# Patient Record
Sex: Female | Born: 1970 | Race: White | Hispanic: No | Marital: Married | Smoking: Never smoker
Health system: Southern US, Community
[De-identification: ages and names within clinical notes are randomized; demographics above are authoritative.]

## PROBLEM LIST (undated history)

## (undated) DIAGNOSIS — Z789 Other specified health status: Secondary | ICD-10-CM

## (undated) HISTORY — PX: BUNIONECTOMY: SHX129

---

## 2008-10-18 ENCOUNTER — Ambulatory Visit: Payer: Self-pay

## 2011-12-23 ENCOUNTER — Ambulatory Visit: Payer: Self-pay

## 2013-01-11 ENCOUNTER — Ambulatory Visit: Payer: Self-pay

## 2014-03-22 ENCOUNTER — Ambulatory Visit: Payer: Self-pay

## 2016-02-12 ENCOUNTER — Other Ambulatory Visit: Payer: Self-pay | Admitting: Physician Assistant

## 2016-02-12 DIAGNOSIS — Z1231 Encounter for screening mammogram for malignant neoplasm of breast: Secondary | ICD-10-CM

## 2016-02-29 ENCOUNTER — Encounter: Payer: Self-pay | Admitting: Radiology

## 2016-02-29 ENCOUNTER — Ambulatory Visit
Admission: RE | Admit: 2016-02-29 | Discharge: 2016-02-29 | Disposition: A | Discharge status: Home / Self Care | Payer: Managed Care, Other (non HMO) | Source: Ambulatory Visit | Attending: Physician Assistant | Admitting: Physician Assistant

## 2016-02-29 DIAGNOSIS — Z1231 Encounter for screening mammogram for malignant neoplasm of breast: Secondary | ICD-10-CM | POA: Diagnosis not present

## 2017-07-21 ENCOUNTER — Other Ambulatory Visit: Payer: Self-pay | Admitting: Obstetrics and Gynecology

## 2017-07-21 DIAGNOSIS — N63 Unspecified lump in unspecified breast: Secondary | ICD-10-CM

## 2017-07-27 ENCOUNTER — Ambulatory Visit
Admission: RE | Admit: 2017-07-27 | Discharge: 2017-07-27 | Disposition: A | Discharge status: Home / Self Care | Payer: 59 | Source: Ambulatory Visit | Attending: Obstetrics and Gynecology | Admitting: Obstetrics and Gynecology

## 2017-07-27 ENCOUNTER — Other Ambulatory Visit: Payer: Self-pay | Admitting: Obstetrics and Gynecology

## 2017-07-27 DIAGNOSIS — N63 Unspecified lump in unspecified breast: Secondary | ICD-10-CM

## 2017-07-27 DIAGNOSIS — N6320 Unspecified lump in the left breast, unspecified quadrant: Secondary | ICD-10-CM | POA: Insufficient documentation

## 2017-12-17 ENCOUNTER — Emergency Department
Admission: EM | Admit: 2017-12-17 | Discharge: 2017-12-17 | Disposition: A | Discharge status: Home / Self Care | Payer: 59 | Attending: Emergency Medicine | Admitting: Emergency Medicine

## 2017-12-17 ENCOUNTER — Emergency Department: Payer: 59

## 2017-12-17 ENCOUNTER — Encounter: Payer: Self-pay | Admitting: *Deleted

## 2017-12-17 ENCOUNTER — Other Ambulatory Visit: Payer: Self-pay

## 2017-12-17 DIAGNOSIS — S92352A Displaced fracture of fifth metatarsal bone, left foot, initial encounter for closed fracture: Secondary | ICD-10-CM | POA: Insufficient documentation

## 2017-12-17 DIAGNOSIS — Y9301 Activity, walking, marching and hiking: Secondary | ICD-10-CM | POA: Diagnosis not present

## 2017-12-17 DIAGNOSIS — S99922A Unspecified injury of left foot, initial encounter: Secondary | ICD-10-CM | POA: Diagnosis present

## 2017-12-17 DIAGNOSIS — Y999 Unspecified external cause status: Secondary | ICD-10-CM | POA: Insufficient documentation

## 2017-12-17 DIAGNOSIS — W109XXA Fall (on) (from) unspecified stairs and steps, initial encounter: Secondary | ICD-10-CM | POA: Insufficient documentation

## 2017-12-17 DIAGNOSIS — Y929 Unspecified place or not applicable: Secondary | ICD-10-CM | POA: Diagnosis not present

## 2017-12-17 DIAGNOSIS — S92252A Displaced fracture of navicular [scaphoid] of left foot, initial encounter for closed fracture: Secondary | ICD-10-CM | POA: Diagnosis not present

## 2017-12-17 MED ORDER — MELOXICAM 15 MG PO TABS: 15.0000 mg | ORAL_TABLET | Freq: Every day | ORAL | 0 refills | Status: AC

## 2017-12-17 MED ORDER — HYDROCODONE-ACETAMINOPHEN 5-325 MG PO TABS: 1.0000 | ORAL_TABLET | ORAL | 0 refills | Status: DC | PRN

## 2017-12-17 NOTE — ED Notes (Signed)
Patient reports she was going down stairs, missed the last two and fell. Patient reports popping in foot when she fell. Patient reports pain to left ankle and medial foot. Norco, 5-325 and 2 aleve approx 30 minutes ago.

## 2017-12-17 NOTE — ED Notes (Signed)
This RN reviewed discharge instructions, follow-up care, prescriptions, cryotherapy, and need for elevation with patient. Patient verbalized understanding of all reviewed information.  Patient stable, with no distress noted at this time. 

## 2017-12-17 NOTE — ED Provider Notes (Signed)
[facility] Emergency Department Provider Note  ____________________________________________  Time seen: Approximately 10:11 PM  I have reviewed the triage vital signs and the nursing notes.   HISTORY  Chief Complaint Ankle Pain    HPI Barbara Herring is a 47 y.o. female who presents the emergency department complaining of left ankle and foot pain.  Patient was rushing down a flight of steps, missed the last step landing awkwardly on her left ankle.  She reports that she inverted her ankle.  Patient reports pain, swelling immediately.  Patient's husband is an orthopedic physician assistant and splinted the foot, and gave her Norco and Aleve prior to arrival.  Patient is currently resting comfortably but states that she is unable to bear weight on the left ankle.  No other injury or complaint.  She did not hit her head or lose consciousness during the fall.    No past medical history on file.  There are no active problems to display for this patient.   No past surgical history on file.  Prior to Admission medications   Medication Sig Start Date End Date Taking? Authorizing Provider  HYDROcodone-acetaminophen (NORCO/VICODIN) 5-325 MG tablet Take 1 tablet by mouth every 4 (four) hours as needed for moderate pain. 12/17/17   Riata Ikeda, Delorise Royals, PA-C  meloxicam (MOBIC) 15 MG tablet Take 1 tablet (15 mg total) by mouth daily. 12/17/17   Reinhold Rickey, Delorise Royals, PA-C    Allergies Patient has no known allergies.  Family History  Problem Relation Age of Onset  . Breast cancer Neg Hx     Social History Social History   Tobacco Use  . Smoking status: Never Smoker  . Smokeless tobacco: Never Used  Substance Use Topics  . Alcohol use: Never    Frequency: Never  . Drug use: Never     Review of Systems  Constitutional: No fever/chills Eyes: No visual changes.  Cardiovascular: no chest pain. Respiratory: no cough. No SOB. Gastrointestinal: No  abdominal pain.  No nausea, no vomiting.  Musculoskeletal: Positive for left ankle pain and injury. Skin: Negative for rash, abrasions, lacerations, ecchymosis. Neurological: Negative for headaches, focal weakness or numbness. 10-point ROS otherwise negative.  ____________________________________________   PHYSICAL EXAM:  VITAL SIGNS: ED Triage Vitals  Enc Vitals Group     BP 12/17/17 2130 (!) 150/75     Pulse Rate 12/17/17 2130 83     Resp 12/17/17 2130 18     Temp 12/17/17 2130 98.6 F (37 C)     Temp Source 12/17/17 2130 Oral     SpO2 12/17/17 2130 99 %     Weight 12/17/17 2131 220 lb (99.8 kg)     Height 12/17/17 2131 5\' 3"  (1.6 m)     Head Circumference --      Peak Flow --      Pain Score 12/17/17 2131 8     Pain Loc --      Pain Edu? --      Excl. in GC? --      Constitutional: Alert and oriented. Well appearing and in no acute distress. Eyes: Conjunctivae are normal. PERRL. EOMI. Head: Atraumatic. Neck: No stridor.    Cardiovascular: Normal rate, regular rhythm. Normal S1 and S2.  Good peripheral circulation. Respiratory: Normal respiratory effort without tachypnea or retractions. Lungs CTAB. Good air entry to the bases with no decreased or absent breath sounds. Musculoskeletal: Full range of motion to all extremities. No gross deformities appreciated.  Visualization of the left  ankle reveals edema without significant ecchymosis.  No abrasions or lacerations noted.  At this time, no range of motion testing is performed.  Patient is minimally tender to palpation over bilateral malleolus is, she is most tender to palpation along the talonavicular joint line.  No palpable abnormality or deficit.  Patient also has some tenderness to palpation over the proximal fourth and fifth metatarsal region.  No palpable abnormality to this region.  Dorsalis pedis pulse intact.  Sensation intact all 5 digits.  Capillary refill less than 2 seconds all digits. Neurologic:  Normal speech  and language. No gross focal neurologic deficits are appreciated.  Skin:  Skin is warm, dry and intact. No rash noted. Psychiatric: Mood and affect are normal. Speech and behavior are normal. Patient exhibits appropriate insight and judgement.   ____________________________________________   LABS (all labs ordered are listed, but only abnormal results are displayed)  Labs Reviewed - No data to display ____________________________________________  EKG   ____________________________________________  RADIOLOGY I personally viewed and evaluated these images as part of my medical decision making, as well as reviewing the written report by the radiologist.  I concur with radiologist finding of navicular fracture, fracture the fifth metatarsal.  Dg Ankle Complete Left  Result Date: 12/17/2017 CLINICAL DATA:  Status post fall with left foot and ankle pain. EXAM: LEFT ANKLE COMPLETE - 3+ VIEW COMPARISON:  None. FINDINGS: There is displaced fracture of the navicular bone of the midfoot. The visualized tibia and fibula demonstrate no acute fracture or dislocation. IMPRESSION: Displaced fracture of the navicular bone of the midfoot. Electronically Signed   By: Sherian Rein M.D.   On: 12/17/2017 21:59   Dg Foot Complete Left  Result Date: 12/17/2017 CLINICAL DATA:  Status post fall with left foot and ankle pain. EXAM: LEFT FOOT - COMPLETE 3+ VIEW COMPARISON:  None. FINDINGS: There is displaced fracture of the fifth metatarsal. There is fracture of the navicular bone. There is no dislocation. IMPRESSION: Fracture of the fifth metatarsal.  Fracture of navicular bone. Electronically Signed   By: Sherian Rein M.D.   On: 12/17/2017 22:00    ____________________________________________    PROCEDURES  Procedure(s) performed:    Procedures    Medications - No data to display   ____________________________________________   INITIAL IMPRESSION / ASSESSMENT AND PLAN / ED  COURSE  Pertinent labs & imaging results that were available during my care of the patient were reviewed by me and considered in my medical decision making (see chart for details).  Review of the [place] CSRS was performed in accordance of the [facility] prior to dispensing any controlled drugs.      Patient's diagnosis is consistent with navicular fracture and fracture of the fifth metatarsal.  Patient presented to the emergency department complaining of left ankle and foot pain after missing a step on the stairway.  Patient was unable to bear weight, with edema to the area.  Patient arrived with ankle splinted as her husband is a Database administrator.  Patient was given meds for pain and was relatively pain-free at time of evaluation.  X-rays reveal navicular fracture, mildly displaced as well as a fracture of the fifth metatarsal, also displaced.  No indication of acute ligamentous rupture.  No indication of Lisfranc injury.  No indication for further imaging.  Patient's foot is resplinted in the emergency department, she will be prescribed meloxicam and Vicodin for symptom relief.  Patient is to follow-up with podiatry for further management..  Patient is given  ED precautions to return to the ED for any worsening or new symptoms.     ____________________________________________  FINAL CLINICAL IMPRESSION(S) / ED DIAGNOSES  Final diagnoses:  Closed displaced fracture of navicular bone of left foot, initial encounter  Closed displaced fracture of fifth metatarsal bone of left foot, initial encounter      NEW MEDICATIONS STARTED DURING THIS VISIT:  ED Discharge Orders        Ordered    HYDROcodone-acetaminophen (NORCO/VICODIN) 5-325 MG tablet  Every 4 hours PRN     12/17/17 2229    meloxicam (MOBIC) 15 MG tablet  Daily     12/17/17 2229          This chart was dictated using voice recognition software/Dragon. Despite best efforts to proofread, errors can occur which can change  the meaning. Any change was purely unintentional.    Racheal PatchesCuthriell, Latrise Bowland D, PA-C 12/17/17 2231    Pershing ProudSchaevitz, Myra Rudeavid Matthew, MD 12/17/17 [phone]

## 2017-12-17 NOTE — ED Notes (Signed)
X-ray at bedside

## 2017-12-17 NOTE — ED Notes (Signed)
Splint in place - patient's spouse a PA; approved by ED physician. Patient has crutches, and verbalized understanding of how to use them.

## 2017-12-17 NOTE — ED Triage Notes (Signed)
Pt states she was rushing inside the house and fell down two steps.  Pt has left ankle pain.  Ace wrap in place.

## 2017-12-18 ENCOUNTER — Other Ambulatory Visit: Payer: Self-pay | Admitting: Podiatry

## 2017-12-21 ENCOUNTER — Encounter: Payer: Self-pay | Admitting: *Deleted

## 2017-12-21 ENCOUNTER — Other Ambulatory Visit: Payer: Self-pay

## 2017-12-22 NOTE — Discharge Instructions (Signed)
[facility] °[facility] ° °POST OPERATIVE INSTRUCTIONS FOR DR. TROXLER AND DR. FOWLER °[facility] ° ° °1. Take your medication as prescribed.  Pain medication should be taken only as needed. ° °2. Keep the dressing clean, dry and intact. ° °3. Keep your foot elevated above the heart level for the first 48 hours. ° °4. Walking to the bathroom and brief periods of walking are acceptable, unless we have instructed you to be non-weight bearing. ° °5. Always wear your post-op shoe when walking.  Always use your crutches if you are to be non-weight bearing. ° °6. Do not take a shower. Baths are permissible as long as the foot is kept out of the water.  ° °7. Every hour you are awake:  °- Bend your knee 15 times. °- Flex foot 15 times °- Massage calf 15 times ° °8. Call [facility] ([phone]) if any of the following problems occur: °- You develop a temperature or fever. °- The bandage becomes saturated with blood. °- Medication does not stop your pain. °- Injury of the foot occurs. °- Any symptoms of infection including redness, odor, or red streaks running from wound. ° ° °General Anesthesia, Adult, Care After °These instructions provide you with information about caring for yourself after your procedure. Your health care provider may also give you more specific instructions. Your treatment has been planned according to current medical practices, but problems sometimes occur. Call your health care provider if you have any problems or questions after your procedure. °What can I expect after the procedure? °After the procedure, it is common to have: °· Vomiting. °· A sore throat. °· Mental slowness. ° °It is common to feel: °· Nauseous. °· Cold or shivery. °· Sleepy. °· Tired. °· Sore or achy, even in parts of your body where you did not have surgery. ° °Follow these instructions at home: °For at least 24 hours after the procedure: °· Do not: °? Participate in  activities where you could fall or become injured. °? Drive. °? Use heavy machinery. °? Drink alcohol. °? Take sleeping pills or medicines that cause drowsiness. °? Make important decisions or sign legal documents. °? Take care of children on your own. °· Rest. °Eating and drinking °· If you vomit, drink water, juice, or soup when you can drink without vomiting. °· Drink enough fluid to keep your urine clear or pale yellow. °· Make sure you have little or no nausea before eating solid foods. °· Follow the diet recommended by your health care provider. °General instructions °· Have a responsible adult stay with you until you are awake and alert. °· Return to your normal activities as told by your health care provider. Ask your health care provider what activities are safe for you. °· Take over-the-counter and prescription medicines only as told by your health care provider. °· If you smoke, do not smoke without supervision. °· Keep all follow-up visits as told by your health care provider. This is important. °Contact a health care provider if: °· You continue to have nausea or vomiting at home, and medicines are not helpful. °· You cannot drink fluids or start eating again. °· You cannot urinate after 8-12 hours. °· You develop a skin rash. °· You have fever. °· You have increasing redness at the site of your procedure. °Get help right away if: °· You have difficulty breathing. °· You have chest pain. °· You have unexpected bleeding. °· You feel that you   are having a life-threatening or urgent problem. °This information is not intended to replace advice given to you by your health care provider. Make sure you discuss any questions you have with your health care provider. °Document Released: 08/25/2000 Document Revised: 10/22/2015 Document Reviewed: 05/03/2015 °Elsevier Interactive Patient Education © 2018 [employer] ° °

## 2017-12-23 ENCOUNTER — Encounter
Admission: RE | Disposition: A | Discharge status: Home / Self Care | Payer: Self-pay | Source: Ambulatory Visit | Attending: Podiatry

## 2017-12-23 ENCOUNTER — Ambulatory Visit: Payer: 59 | Admitting: Anesthesiology

## 2017-12-23 ENCOUNTER — Ambulatory Visit
Admission: RE | Admit: 2017-12-23 | Discharge: 2017-12-23 | Disposition: A | Discharge status: Home / Self Care | Payer: 59 | Source: Ambulatory Visit | Attending: Podiatry | Admitting: Podiatry

## 2017-12-23 DIAGNOSIS — S92255A Nondisplaced fracture of navicular [scaphoid] of left foot, initial encounter for closed fracture: Secondary | ICD-10-CM | POA: Insufficient documentation

## 2017-12-23 DIAGNOSIS — X501XXA Overexertion from prolonged static or awkward postures, initial encounter: Secondary | ICD-10-CM | POA: Diagnosis not present

## 2017-12-23 DIAGNOSIS — W19XXXA Unspecified fall, initial encounter: Secondary | ICD-10-CM | POA: Diagnosis not present

## 2017-12-23 DIAGNOSIS — Z6838 Body mass index (BMI) 38.0-38.9, adult: Secondary | ICD-10-CM | POA: Insufficient documentation

## 2017-12-23 DIAGNOSIS — S92352A Displaced fracture of fifth metatarsal bone, left foot, initial encounter for closed fracture: Secondary | ICD-10-CM | POA: Insufficient documentation

## 2017-12-23 HISTORY — DX: Other specified health status: Z78.9

## 2017-12-23 HISTORY — PX: OPEN REDUCTION INTERNAL FIXATION (ORIF) FOOT LISFRANC FRACTURE: SHX5990

## 2017-12-23 SURGERY — OPEN REDUCTION INTERNAL FIXATION (ORIF) FOOT LISFRANC FRACTURE
Anesthesia: General | Site: Foot | Laterality: Left | Wound class: Clean

## 2017-12-23 MED ORDER — POVIDONE-IODINE 7.5 % EX SOLN
Freq: Once | CUTANEOUS | Status: AC
  Administered 2017-12-23: 14:00:00 via TOPICAL

## 2017-12-23 MED ORDER — LIDOCAINE HCL (CARDIAC) PF 100 MG/5ML IV SOSY
PREFILLED_SYRINGE | INTRAVENOUS | Status: DC | PRN
  Administered 2017-12-23: 50 mg via INTRATRACHEAL

## 2017-12-23 MED ORDER — FENTANYL CITRATE (PF) 100 MCG/2ML IJ SOLN
INTRAMUSCULAR | Status: DC | PRN
  Administered 2017-12-23 (×3): 12.5 ug via INTRAVENOUS
  Administered 2017-12-23: 50 ug via INTRAVENOUS

## 2017-12-23 MED ORDER — LACTATED RINGERS IV SOLN
INTRAVENOUS | Status: DC
  Administered 2017-12-23: 14:00:00 via INTRAVENOUS

## 2017-12-23 MED ORDER — DEXAMETHASONE SODIUM PHOSPHATE 4 MG/ML IJ SOLN
INTRAMUSCULAR | Status: DC | PRN
  Administered 2017-12-23: 4 mg via INTRAVENOUS

## 2017-12-23 MED ORDER — PROPOFOL 10 MG/ML IV BOLUS
INTRAVENOUS | Status: DC | PRN
  Administered 2017-12-23: 130 mg via INTRAVENOUS
  Administered 2017-12-23: 20 mg via INTRAVENOUS

## 2017-12-23 MED ORDER — GLYCOPYRROLATE 0.2 MG/ML IJ SOLN
INTRAMUSCULAR | Status: DC | PRN
  Administered 2017-12-23: 0.1 mg via INTRAVENOUS

## 2017-12-23 MED ORDER — HYDROCODONE-ACETAMINOPHEN 5-325 MG PO TABS: 1.0000 | ORAL_TABLET | Freq: Four times a day (QID) | ORAL | 0 refills | Status: DC | PRN

## 2017-12-23 MED ORDER — BUPIVACAINE-EPINEPHRINE 0.25% -1:200000 IJ SOLN
INTRAMUSCULAR | Status: DC | PRN
  Administered 2017-12-23: 10 mL

## 2017-12-23 MED ORDER — OXYCODONE HCL 5 MG/5ML PO SOLN: 5.0000 mg | Freq: Once | ORAL | Status: DC | PRN

## 2017-12-23 MED ORDER — CEFAZOLIN SODIUM-DEXTROSE 2-4 GM/100ML-% IV SOLN
2.0000 g | INTRAVENOUS | Status: AC
  Administered 2017-12-23: 2 g via INTRAVENOUS

## 2017-12-23 MED ORDER — OXYCODONE HCL 5 MG PO TABS: 5.0000 mg | ORAL_TABLET | Freq: Once | ORAL | Status: DC | PRN

## 2017-12-23 MED ORDER — ONDANSETRON HCL 4 MG/2ML IJ SOLN
INTRAMUSCULAR | Status: DC | PRN
  Administered 2017-12-23: 4 mg via INTRAVENOUS

## 2017-12-23 MED ORDER — MIDAZOLAM HCL 5 MG/5ML IJ SOLN
INTRAMUSCULAR | Status: DC | PRN
  Administered 2017-12-23: 2 mg via INTRAVENOUS

## 2017-12-23 MED ORDER — BUPIVACAINE LIPOSOME 1.3 % IJ SUSP
INTRAMUSCULAR | Status: DC | PRN
  Administered 2017-12-23: 10 mL

## 2017-12-23 MED ORDER — FENTANYL CITRATE (PF) 100 MCG/2ML IJ SOLN: 25.0000 ug | INTRAMUSCULAR | Status: DC | PRN

## 2017-12-23 MED ORDER — BUPIVACAINE HCL (PF) 0.25 % IJ SOLN
INTRAMUSCULAR | Status: DC | PRN
  Administered 2017-12-23: 10 mL

## 2017-12-23 SURGICAL SUPPLY — 48 items
2.0 MM DRILL BIT ×2 IMPLANT
BANDAGE ELASTIC 4 LF NS (GAUZE/BANDAGES/DRESSINGS) ×2 IMPLANT
BLADE SURG MINI STRL (BLADE) IMPLANT
BNDG COHESIVE 4X5 TAN STRL (GAUZE/BANDAGES/DRESSINGS) ×2 IMPLANT
BNDG ESMARK 4X12 TAN STRL LF (GAUZE/BANDAGES/DRESSINGS) ×2 IMPLANT
BNDG GAUZE 4.5X4.1 6PLY STRL (MISCELLANEOUS) ×2 IMPLANT
BNDG STRETCH 4X75 STRL LF (GAUZE/BANDAGES/DRESSINGS) ×2 IMPLANT
CANISTER SUCT 1200ML W/VALVE (MISCELLANEOUS) ×2 IMPLANT
COVER PIN YLW 0.028-062 (MISCELLANEOUS) IMPLANT
DRAPE FLUOR MINI C-ARM 54X84 (DRAPES) ×2 IMPLANT
DURAPREP 26ML APPLICATOR (WOUND CARE) ×2 IMPLANT
ELECT REM PT RETURN 9FT ADLT (ELECTROSURGICAL) ×2
ELECTRODE REM PT RTRN 9FT ADLT (ELECTROSURGICAL) ×1 IMPLANT
GAUZE PETRO XEROFOAM 1X8 (MISCELLANEOUS) ×2 IMPLANT
GAUZE SPONGE 4X4 12PLY STRL (GAUZE/BANDAGES/DRESSINGS) ×2 IMPLANT
GLOVE BIO SURGEON STRL SZ7.5 (GLOVE) ×4 IMPLANT
GLOVE INDICATOR 8.0 STRL GRN (GLOVE) ×4 IMPLANT
GOWN STRL REUS W/ TWL LRG LVL3 (GOWN DISPOSABLE) ×2 IMPLANT
GOWN STRL REUS W/TWL LRG LVL3 (GOWN DISPOSABLE) ×2
K-WIRE ACE 1.6X6 (WIRE) ×2
K-WIRE DBL END TROCAR 6X.045 (WIRE)
K-WIRE DBL END TROCAR 6X.062 (WIRE)
KIT TURNOVER KIT A (KITS) ×2 IMPLANT
KWIRE ACE 1.6X6 (WIRE) ×1 IMPLANT
KWIRE DBL END TROCAR 6X.045 (WIRE) IMPLANT
KWIRE DBL END TROCAR 6X.062 (WIRE) IMPLANT
NEEDLE FILTER BLUNT 18X 1/2SAF (NEEDLE)
NEEDLE FILTER BLUNT 18X1 1/2 (NEEDLE) IMPLANT
NEEDLE HYPO 27GX1-1/4 (NEEDLE) ×2 IMPLANT
NS IRRIG 500ML POUR BTL (IV SOLUTION) ×2 IMPLANT
PACK EXTREMITY ARMC (MISCELLANEOUS) ×2 IMPLANT
PENCIL SMOKE EVACUATOR (MISCELLANEOUS) ×2 IMPLANT
PLATE F3 FRAG HI FLEX RT (Plate) ×2 IMPLANT
SCREW PEG 2.5X14 NONLOCK (Screw) ×2 IMPLANT
SCREW PEG LOCK 2.5X10 (Peg) ×2 IMPLANT
SCREW PEG LOCK 2.5X12 (Screw) ×4 IMPLANT
SCREW PEG LOCK 2.5X14 (Peg) ×2 IMPLANT
STOCKINETTE IMPERVIOUS LG (DRAPES) ×2 IMPLANT
STRAP BODY AND KNEE 60X3 (MISCELLANEOUS) ×2 IMPLANT
STRIP CLOSURE SKIN 1/4X4 (GAUZE/BANDAGES/DRESSINGS) ×2 IMPLANT
SUT ETHILON 4-0 (SUTURE)
SUT ETHILON 4-0 FS2 18XMFL BLK (SUTURE)
SUT ETHILON 5-0 FS-2 18 BLK (SUTURE) IMPLANT
SUT MNCRL+ 5-0 UNDYED PC-3 (SUTURE) ×1 IMPLANT
SUT MONOCRYL 5-0 (SUTURE) ×1
SUT VIC AB 4-0 FS2 27 (SUTURE) ×2 IMPLANT
SUTURE ETHLN 4-0 FS2 18XMF BLK (SUTURE) IMPLANT
SYR 10ML LL (SYRINGE) ×2 IMPLANT

## 2017-12-23 NOTE — Op Note (Signed)
Operative note   Surgeon:Baraa Tubbs Armed forces logistics/support/administrative officerowler    Assistant: None    Preop diagnosis: Left fifth metatarsal fracture    Postop diagnosis: Same    Procedure: ORIF left fifth metatarsal fracture    EBL: Minimal    Anesthesia:local and general.  Local consisted of 0.25% bupivacaine with epinephrine preoperatively.  A total of 10 cc was used.  At the end of the procedure a one-to-one mixture of 0.25% bupivacaine plain and 10 cc of long-acting Exparel was infiltrated on the surgical site.  A total of 16 cc was used.    Hemostasis: Ankle tourniquet inflated to 200 mmHg for 50 minutes    Specimen: None    Complications: None    Operative indications:Barbara Herring CzarLeigh Lenard ForthMundy is an 47 y.o. that presents today for surgical intervention.  The risks/benefits/alternatives/complications have been discussed and consent has been given.    Procedure:  Patient was brought into the OR and placed on the operating table in thesupine position. After anesthesia was obtained theleft lower extremity was prepped and draped in usual sterile fashion.  Attention was directed to the dorsal lateral left fifth MTPJ where longitudinal incision was performed.  Sharp and blunt dissection carried down to the periosteum.  Subperiosteal dissection was then undertaken.  There was noted to be a laterally and dorsally displaced fifth metatarsal fracture with a butterfly fragment.  This was then reduced with bone reduction clamp to an anatomically aligned position.  Next a L plate from the F3 fragment screw set by [employer] was placed.  2 locking screws were placed distal to the fracture site.  A nonlocking screw was placed against the lateral wall of the fifth metatarsal to compress the plate against the bone.  2 other screw holes were filled with locking screws.  Anatomic alignment was noted with good reduction and stability.  The wound was flushed with copious amounts of irrigation.  Layered closure was performed with 4-0 Vicryl the subtenons  tissue and periosteum.  5-0 Monocryl undyed for the skin was used.  At this time attention was directed to the dorsal midfoot where a known navicular fracture was seen preoperatively.  I put the foot through midfoot stress to see if there was any motion to the fracture site or instability to this.  The small chip fracture on the dorsal navicular was noted but did not displace at all.  It remained anatomic throughout stress.  This time I elected not to undergo surgical reduction of this fracture site.  Patient was placed in a well compressive sterile dressing.  She was then placed in a equalizer walker boot with foot at 90 degrees.    Patient tolerated the procedure and anesthesia well.  Was transported from the OR to the PACU with all vital signs stable and vascular status intact. To be discharged per routine protocol.  Will follow up in approximately 1 week in the outpatient clinic.

## 2017-12-23 NOTE — Anesthesia Procedure Notes (Signed)
Procedure Name: LMA Insertion Date/Time: 12/23/2017 4:00 PM Performed by: Jimmy PicketAmyot, Demi Trieu, CRNA Pre-anesthesia Checklist: Patient identified, Emergency Drugs available, Suction available, Timeout performed and Patient being monitored Patient Re-evaluated:Patient Re-evaluated prior to induction Oxygen Delivery Method: Circle system utilized Preoxygenation: Pre-oxygenation with 100% oxygen Induction Type: IV induction LMA: LMA inserted LMA Size: 4.0 Number of attempts: 1 Placement Confirmation: positive ETCO2 and breath sounds checked- equal and bilateral Tube secured with: Tape

## 2017-12-23 NOTE — Transfer of Care (Addendum)
Immediate Anesthesia Transfer of Care Note  Patient: Barbara Herring  Procedure(s) Performed: OPEN REDUCTION INTERNAL FIXATION (ORIF) FOOT LISFRANC FRACTURE/ 5TH METATARSAL LEFT/ NAVICULAR [id number], [phone] (Left Foot)  Patient Location: PACU  Anesthesia Type: General LMA  Level of Consciousness: awake, alert  and patient cooperative  Airway and Oxygen Therapy: Patient Spontanous Breathing and Patient connected to supplemental oxygen  Post-op Assessment: Post-op Vital signs reviewed, Patient's Cardiovascular Status Stable, Respiratory Function Stable, Patent Airway and No signs of Nausea or vomiting  Post-op Vital Signs: Reviewed and stable  Complications: No apparent anesthesia complications

## 2017-12-23 NOTE — Anesthesia Postprocedure Evaluation (Signed)
Anesthesia Post Note  Patient: Barbara MalmLaurie Leigh Folkes  Procedure(s) Performed: OPEN REDUCTION INTERNAL FIXATION (ORIF) FOOT LISFRANC FRACTURE/ 5TH METATARSAL LEFT/ NAVICULAR [id number], [phone] (Left Foot)  Patient location during evaluation: PACU Anesthesia Type: General Level of consciousness: awake and alert Pain management: pain level controlled Vital Signs Assessment: post-procedure vital signs reviewed and stable Respiratory status: spontaneous breathing Cardiovascular status: blood pressure returned to baseline Postop Assessment: no headache Anesthetic complications: no    Verner Cholunkle, III,  Reshaun Briseno D

## 2017-12-23 NOTE — H&P (Signed)
HISTORY AND PHYSICAL INTERVAL NOTE:  12/23/2017  3:39 PM  Barbara Herring  has presented today for surgery, with the diagnosis of S92.352A,  S92.255A.  The various methods of treatment have been discussed with the patient.  No guarantees were given.  After consideration of risks, benefits and other options for treatment, the patient has consented to surgery.  I have reviewed the patients' chart and labs.    Patient Vitals for the past 24 hrs:  BP Temp Temp src Pulse Resp SpO2 Height Weight  12/23/17 1358 125/66 98.1 F (36.7 C) Temporal 88 16 99 % 5\' 3"  (1.6 m) 99.8 kg (220 lb)    Herring history and physical examination was performed in my office.  The patient was reexamined.  There have been no changes to this history and physical examination.  Gwyneth RevelsFowler, Barbara Herring

## 2017-12-23 NOTE — Anesthesia Preprocedure Evaluation (Signed)
Anesthesia Evaluation  Patient identified by MRN, date of birth, ID band Patient awake    Reviewed: Allergy & Precautions, H&P , NPO status , Patient's Chart, lab work & pertinent test results  Airway Mallampati: I  TM Distance: >3 FB Neck ROM: full    Dental no notable dental hx.    Pulmonary neg pulmonary ROS,    Pulmonary exam normal breath sounds clear to auscultation       Cardiovascular negative cardio ROS Normal cardiovascular exam Rhythm:regular Rate:Normal     Neuro/Psych    GI/Hepatic negative GI ROS, Neg liver ROS,   Endo/Other  negative endocrine ROS  Renal/GU negative Renal ROS     Musculoskeletal   Abdominal   Peds  Hematology negative hematology ROS (+)   Anesthesia Other Findings   Reproductive/Obstetrics negative OB ROS                             Anesthesia Physical Anesthesia Plan  ASA: I  Anesthesia Plan: General LMA   Post-op Pain Management:    Induction:   PONV Risk Score and Plan:   Airway Management Planned:   Additional Equipment:   Intra-op Plan:   Post-operative Plan:   Informed Consent: I have reviewed the patients History and Physical, chart, labs and discussed the procedure including the risks, benefits and alternatives for the proposed anesthesia with the patient or authorized representative who has indicated his/her understanding and acceptance.     Plan Discussed with:   Anesthesia Plan Comments:         Anesthesia Quick Evaluation

## 2017-12-24 ENCOUNTER — Encounter: Payer: Self-pay | Admitting: Podiatry

## 2019-08-28 ENCOUNTER — Ambulatory Visit: Payer: Self-pay | Attending: Internal Medicine

## 2019-08-28 DIAGNOSIS — Z23 Encounter for immunization: Secondary | ICD-10-CM

## 2019-08-28 NOTE — Progress Notes (Signed)
   Covid-19 Vaccination Clinic  Name:  Barbara Herring    MRN: [id number] DOB: 26-Feb-1971  08/28/2019  [name] was observed post Covid-19 immunization for 15 minutes without incident. She was provided with Vaccine Information Sheet and instruction to access the V-Safe system.   [name] was instructed to call 911 with any severe reactions post vaccine: Marland Kitchen Difficulty breathing  . Swelling of face and throat  . A fast heartbeat  . A bad rash all over body  . Dizziness and weakness   Immunizations Administered    Name Date Dose VIS Date Route   Pfizer COVID-19 Vaccine 08/28/2019 11:28 AM 0.3 mL 05/13/2019 Intramuscular   Manufacturer: [employer], [employer]   Lot: [id number]   NDC: [address]-8

## 2019-09-20 ENCOUNTER — Ambulatory Visit: Payer: Self-pay | Attending: Internal Medicine

## 2019-09-20 DIAGNOSIS — Z23 Encounter for immunization: Secondary | ICD-10-CM

## 2019-09-20 NOTE — Progress Notes (Signed)
   Covid-19 Vaccination Clinic  Name:  Barbara Herring    MRN: [id number] DOB: [birth year]  09/20/2019  Barbara Herring was observed post Covid-19 immunization for 15 minutes without incident. She was provided with Vaccine Information Sheet and instruction to access the V-Safe system.   Barbara Herring was instructed to call 911 with any severe reactions post vaccine: Marland Kitchen Difficulty breathing  . Swelling of face and throat  . A fast heartbeat  . A bad rash all over body  . Dizziness and weakness   Immunizations Administered    Name Date Dose VIS Date Route   Pfizer COVID-19 Vaccine 09/20/2019  1:24 PM 0.3 mL 07/27/2018 Intramuscular   Manufacturer: [employer], [employer]   Lot: [id number]   NDC: [address]-9

## 2021-08-14 ENCOUNTER — Other Ambulatory Visit: Payer: Self-pay | Admitting: Physician Assistant

## 2021-08-14 DIAGNOSIS — Z1231 Encounter for screening mammogram for malignant neoplasm of breast: Secondary | ICD-10-CM

## 2021-09-24 ENCOUNTER — Ambulatory Visit
Admission: RE | Admit: 2021-09-24 | Discharge: 2021-09-24 | Disposition: A | Discharge status: Home / Self Care | Payer: Managed Care, Other (non HMO) | Source: Ambulatory Visit | Attending: Physician Assistant | Admitting: Physician Assistant

## 2021-09-24 DIAGNOSIS — Z1231 Encounter for screening mammogram for malignant neoplasm of breast: Secondary | ICD-10-CM | POA: Insufficient documentation

## 2022-03-17 DIAGNOSIS — S2239XA Fracture of one rib, unspecified side, initial encounter for closed fracture: Secondary | ICD-10-CM

## 2022-03-17 HISTORY — DX: Fracture of one rib, unspecified side, initial encounter for closed fracture: S22.39XA

## 2022-04-03 ENCOUNTER — Other Ambulatory Visit: Payer: Self-pay | Admitting: Orthopedic Surgery

## 2022-04-03 DIAGNOSIS — M25562 Pain in left knee: Secondary | ICD-10-CM

## 2022-04-04 ENCOUNTER — Ambulatory Visit
Admission: RE | Admit: 2022-04-04 | Discharge: 2022-04-04 | Disposition: A | Discharge status: Home / Self Care | Payer: Managed Care, Other (non HMO) | Source: Ambulatory Visit | Attending: Orthopedic Surgery | Admitting: Orthopedic Surgery

## 2022-04-04 DIAGNOSIS — M25562 Pain in left knee: Secondary | ICD-10-CM

## 2022-04-09 ENCOUNTER — Other Ambulatory Visit: Payer: Self-pay | Admitting: Orthopedic Surgery

## 2022-04-16 ENCOUNTER — Encounter: Payer: Self-pay | Admitting: Orthopedic Surgery

## 2022-04-21 ENCOUNTER — Ambulatory Visit: Payer: Managed Care, Other (non HMO) | Admitting: Anesthesiology

## 2022-04-21 ENCOUNTER — Other Ambulatory Visit: Payer: Self-pay

## 2022-04-21 ENCOUNTER — Encounter: Payer: Self-pay | Admitting: Orthopedic Surgery

## 2022-04-21 ENCOUNTER — Encounter
Admission: RE | Disposition: A | Discharge status: Home / Self Care | Payer: Self-pay | Source: Home / Self Care | Attending: Orthopedic Surgery

## 2022-04-21 ENCOUNTER — Ambulatory Visit
Admission: RE | Admit: 2022-04-21 | Discharge: 2022-04-21 | Disposition: A | Discharge status: Home / Self Care | Payer: Managed Care, Other (non HMO) | Attending: Orthopedic Surgery | Admitting: Orthopedic Surgery

## 2022-04-21 DIAGNOSIS — M1712 Unilateral primary osteoarthritis, left knee: Secondary | ICD-10-CM | POA: Diagnosis not present

## 2022-04-21 DIAGNOSIS — Z6841 Body Mass Index (BMI) 40.0 and over, adult: Secondary | ICD-10-CM | POA: Insufficient documentation

## 2022-04-21 DIAGNOSIS — S83242A Other tear of medial meniscus, current injury, left knee, initial encounter: Secondary | ICD-10-CM | POA: Insufficient documentation

## 2022-04-21 DIAGNOSIS — Y9341 Activity, dancing: Secondary | ICD-10-CM | POA: Insufficient documentation

## 2022-04-21 DIAGNOSIS — X501XXA Overexertion from prolonged static or awkward postures, initial encounter: Secondary | ICD-10-CM | POA: Diagnosis not present

## 2022-04-21 HISTORY — PX: KNEE ARTHROSCOPY WITH MEDIAL MENISECTOMY: SHX5651

## 2022-04-21 SURGERY — ARTHROSCOPY, KNEE, WITH MEDIAL MENISCECTOMY
Anesthesia: General | Site: Knee | Laterality: Left

## 2022-04-21 MED ORDER — ACETAMINOPHEN 500 MG PO TABS: 1000.0000 mg | ORAL_TABLET | Freq: Three times a day (TID) | ORAL | 2 refills | Status: AC

## 2022-04-21 MED ORDER — ONDANSETRON HCL 4 MG/2ML IJ SOLN
INTRAMUSCULAR | Status: DC | PRN
  Administered 2022-04-21: 4 mg via INTRAVENOUS

## 2022-04-21 MED ORDER — LACTATED RINGERS IR SOLN
Status: DC | PRN
  Administered 2022-04-21: 3000 mL

## 2022-04-21 MED ORDER — FENTANYL CITRATE (PF) 100 MCG/2ML IJ SOLN
INTRAMUSCULAR | Status: DC | PRN
  Administered 2022-04-21 (×4): 25 ug via INTRAVENOUS

## 2022-04-21 MED ORDER — LIDOCAINE HCL (CARDIAC) PF 100 MG/5ML IV SOSY
PREFILLED_SYRINGE | INTRAVENOUS | Status: DC | PRN
  Administered 2022-04-21: 60 mg via INTRATRACHEAL

## 2022-04-21 MED ORDER — CELECOXIB 200 MG PO CAPS: 200.0000 mg | ORAL_CAPSULE | Freq: Once | ORAL | Status: DC

## 2022-04-21 MED ORDER — CEFAZOLIN SODIUM-DEXTROSE 2-4 GM/100ML-% IV SOLN
2.0000 g | INTRAVENOUS | Status: AC
  Administered 2022-04-21: 2 g via INTRAVENOUS

## 2022-04-21 MED ORDER — PROPOFOL 10 MG/ML IV BOLUS
INTRAVENOUS | Status: DC | PRN
  Administered 2022-04-21: 200 mg via INTRAVENOUS

## 2022-04-21 MED ORDER — ASPIRIN 325 MG PO TBEC: 325.0000 mg | DELAYED_RELEASE_TABLET | Freq: Every day | ORAL | 0 refills | Status: AC

## 2022-04-21 MED ORDER — HYDROCODONE-ACETAMINOPHEN 5-325 MG PO TABS: 1.0000 | ORAL_TABLET | ORAL | 0 refills | Status: AC | PRN

## 2022-04-21 MED ORDER — DEXAMETHASONE SODIUM PHOSPHATE 4 MG/ML IJ SOLN
INTRAMUSCULAR | Status: DC | PRN
  Administered 2022-04-21: 4 mg via INTRAVENOUS

## 2022-04-21 MED ORDER — OXYCODONE HCL 5 MG PO TABS: 5.0000 mg | ORAL_TABLET | Freq: Once | ORAL | Status: DC | PRN

## 2022-04-21 MED ORDER — LACTATED RINGERS IV SOLN: INTRAVENOUS | Status: DC

## 2022-04-21 MED ORDER — ACETAMINOPHEN 500 MG PO TABS
1000.0000 mg | ORAL_TABLET | Freq: Once | ORAL | Status: AC
  Administered 2022-04-21: 1000 mg via ORAL

## 2022-04-21 MED ORDER — OXYCODONE HCL 5 MG/5ML PO SOLN: 5.0000 mg | Freq: Once | ORAL | Status: DC | PRN

## 2022-04-21 MED ORDER — HYDROMORPHONE HCL 1 MG/ML IJ SOLN: 0.2500 mg | INTRAMUSCULAR | Status: DC | PRN

## 2022-04-21 MED ORDER — LIDOCAINE-EPINEPHRINE 1 %-1:100000 IJ SOLN
INTRAMUSCULAR | Status: DC | PRN
  Administered 2022-04-21: 12 mL via INTRAMUSCULAR

## 2022-04-21 MED ORDER — MIDAZOLAM HCL 5 MG/5ML IJ SOLN
INTRAMUSCULAR | Status: DC | PRN
  Administered 2022-04-21: 2 mg via INTRAVENOUS

## 2022-04-21 SURGICAL SUPPLY — 38 items
ADPR IRR PORT MULTIBAG TUBE (MISCELLANEOUS)
APL PRP STRL LF DISP 70% ISPRP (MISCELLANEOUS) ×1
BLADE FULL RADIUS 3.5 (BLADE) ×1 IMPLANT
BLADE SHAVER 4.5X7 STR FR (MISCELLANEOUS) IMPLANT
BLADE SURG SZ11 CARB STEEL (BLADE) ×1 IMPLANT
BNDG CMPR 5X4 CHSV STRCH STRL (GAUZE/BANDAGES/DRESSINGS) ×1
BNDG COHESIVE 4X5 TAN STRL LF (GAUZE/BANDAGES/DRESSINGS) ×1 IMPLANT
BNDG ESMARK 6X12 TAN STRL LF (GAUZE/BANDAGES/DRESSINGS) ×1 IMPLANT
CHLORAPREP W/TINT 26 (MISCELLANEOUS) ×1 IMPLANT
COOLER POLAR GLACIER W/PUMP (MISCELLANEOUS) ×1 IMPLANT
COVER LIGHT HANDLE UNIVERSAL (MISCELLANEOUS) ×2 IMPLANT
CUFF TOURN SGL QUICK 30 (TOURNIQUET CUFF)
CUFF TRNQT CYL 30X4X21-28X (TOURNIQUET CUFF) IMPLANT
DRAPE EXTREMITY T 121X128X90 (DISPOSABLE) ×1 IMPLANT
DRAPE IMP U-DRAPE 54X76 (DRAPES) ×1 IMPLANT
GAUZE SPONGE 4X4 12PLY STRL (GAUZE/BANDAGES/DRESSINGS) ×1 IMPLANT
GLOVE SRG 8 PF TXTR STRL LF DI (GLOVE) ×1 IMPLANT
GLOVE SURG ENC MOIS LTX SZ7.5 (GLOVE) ×1 IMPLANT
GLOVE SURG UNDER POLY LF SZ8 (GLOVE) ×1
GOWN STRL REUS W/ TWL LRG LVL3 (GOWN DISPOSABLE) ×1 IMPLANT
GOWN STRL REUS W/TWL LRG LVL3 (GOWN DISPOSABLE) ×1
IV LACTATED RINGER IRRG 3000ML (IV SOLUTION) ×2
IV LR IRRIG 3000ML ARTHROMATIC (IV SOLUTION) ×2 IMPLANT
KIT TURNOVER KIT A (KITS) ×1 IMPLANT
MANIFOLD NEPTUNE II (INSTRUMENTS) ×1 IMPLANT
MAT ABSORB  FLUID 56X50 GRAY (MISCELLANEOUS) ×1
MAT ABSORB FLUID 56X50 GRAY (MISCELLANEOUS) ×1 IMPLANT
PACK ARTHROSCOPY KNEE (MISCELLANEOUS) ×1 IMPLANT
PAD ABD DERMACEA PRESS 5X9 (GAUZE/BANDAGES/DRESSINGS) ×1 IMPLANT
PAD WRAPON POLAR KNEE (MISCELLANEOUS) ×1 IMPLANT
SET Y ADAPTER MULIT-BAG IRRIG (MISCELLANEOUS) IMPLANT
SUT ETHILON 3-0 FS-10 30 BLK (SUTURE) ×2
SUTURE EHLN 3-0 FS-10 30 BLK (SUTURE) ×1 IMPLANT
TOWEL OR 17X26 4PK STRL BLUE (TOWEL DISPOSABLE) ×2 IMPLANT
TUBING INFLOW SET DBFLO PUMP (TUBING) ×1 IMPLANT
TUBING OUTFLOW SET DBLFO PUMP (TUBING) ×1 IMPLANT
WAND WEREWOLF FLOW 90D (MISCELLANEOUS) ×1 IMPLANT
WRAPON POLAR PAD KNEE (MISCELLANEOUS) ×1

## 2022-04-21 NOTE — Anesthesia Preprocedure Evaluation (Signed)
Anesthesia Evaluation  Patient identified by MRN, date of birth, ID band Patient awake    Reviewed: Allergy & Precautions, NPO status , Patient's Chart, lab work & pertinent test results  History of Anesthesia Complications Negative for: history of anesthetic complications  Airway Mallampati: III  TM Distance: >3 FB Neck ROM: full    Dental  (+) Chipped, Teeth Intact   Pulmonary neg pulmonary ROS   Pulmonary exam normal        Cardiovascular negative cardio ROS Normal cardiovascular exam     Neuro/Psych negative neurological ROS  negative psych ROS   GI/Hepatic negative GI ROS, Neg liver ROS,,,  Endo/Other    Morbid obesity  Renal/GU      Musculoskeletal   Abdominal   Peds  Hematology negative hematology ROS (+)   Anesthesia Other Findings Past Medical History: 03/17/2022: Fractured rib     Comment:  Left side  Past Surgical History: No date: BUNIONECTOMY; Left No date: CESAREAN SECTION 12/23/2017: OPEN REDUCTION INTERNAL FIXATION (ORIF) FOOT LISFRANC  FRACTURE; Left     Comment:  Procedure: OPEN REDUCTION INTERNAL FIXATION (ORIF) FOOT               LISFRANC FRACTURE/ 5TH METATARSAL LEFT/ NAVICULAR 18299,               [id number];  Surgeon: Gwyneth Revels, DPM;  Location: [facility]               [facility];  Service: Podiatry;  Laterality: Left;  F3               PLATE SET (BIOMET) SMALL FRAG SET  BMI    Body Mass Index: 45.17 kg/m      Reproductive/Obstetrics negative OB ROS                             Anesthesia Physical Anesthesia Plan  ASA: 3  Anesthesia Plan: General LMA   Post-op Pain Management: Tylenol PO (pre-op), Toradol IV (intra-op), Oxycodone PO and Dilaudid IV   Induction: Intravenous  PONV Risk Score and Plan: 4 or greater and Dexamethasone, Ondansetron, Midazolam and Treatment may vary due to age or medical condition  Airway Management Planned:  LMA  Additional Equipment:   Intra-op Plan:   Post-operative Plan: Extubation in OR  Informed Consent: I have reviewed the patients History and Physical, chart, labs and discussed the procedure including the risks, benefits and alternatives for the proposed anesthesia with the patient or authorized representative who has indicated his/her understanding and acceptance.     Dental Advisory Given  Plan Discussed with: Anesthesiologist, CRNA and Surgeon  Anesthesia Plan Comments: (Patient consented for risks of anesthesia including but not limited to:  - adverse reactions to medications - damage to eyes, teeth, lips or other oral mucosa - nerve damage due to positioning  - sore throat or hoarseness - Damage to heart, brain, nerves, lungs, other parts of body or loss of life  Patient voiced understanding.)       Anesthesia Quick Evaluation

## 2022-04-21 NOTE — H&P (Signed)
Paper H&P to be scanned into permanent record. H&P reviewed. No significant changes noted.  

## 2022-04-21 NOTE — Discharge Instructions (Addendum)
Arthroscopic Knee Surgery - Partial Meniscectomy   Post-Op Instructions   1. Bracing or crutches: Crutches will be provided at the time of discharge from the surgery center if you do not already have them.   2. Ice: You may be provided with a device [facility]) that allows you to ice the affected area effectively. Otherwise you can ice manually.    3. Driving:  Plan on not driving for at least two weeks. Please note that you are advised NOT to drive while taking narcotic pain medications as you may be impaired and unsafe to drive.   4. Activity: Ankle pumps several times an hour while awake to prevent blood clots. Weight bearing: as tolerated. Use crutches for as needed (usually ~1 week or less) until pain allows you to ambulate without a limp. Bending and straightening the knee is unlimited. Elevate knee above heart level as much as possible for one week. Avoid standing more than 5 minutes (consecutively) for the first week.  Avoid long distance travel for 2 weeks.  5. Medications:  - You have been provided a prescription for narcotic pain medicine. After surgery, take 1-2 narcotic tablets every 4 hours if needed for severe pain.  - You may take up to 3000mg /day of tylenol (acetaminophen). You can take 1000mg  3x/day. Please check your narcotic. If you have acetaminophen in your narcotic (each tablet will be 325mg ), be careful not to exceed a total of 3000mg /day of acetaminophen.  - A prescription for anti-nausea medication will be provided in case the narcotic medicine or anesthesia causes nausea - take 1 tablet every 6 hours only if nauseated.  - Take meloxicam 15mg /day to help with postoperative pain and swelling - Take enteric coated aspirin 325 mg once daily for 2 weeks to prevent blood clots.    6. Bandages: The physical therapist should change the bandages at the first post-op appointment. If needed, the dressing supplies have been provided to you.   7. Physical Therapy: 1-2 times per  week for 6 weeks. Therapy typically starts on post operative day 3-7. You have been provided an order for physical therapy. The therapist will provide home exercises.   8. Work: May return to full work usually around 2 weeks after 1st post-operative visit. May do light duty/desk job in approximately 1-2 weeks when off of narcotics, pain is well-controlled, and swelling has decreased. Labor intensive jobs may require 4-6 weeks to return.      9. Post-Op Appointments: Your first post-op appointment will be with Dr. in approximately 2 weeks time.    If you find that they have not been scheduled please call the Orthopaedic Appointment front desk at [phone].

## 2022-04-21 NOTE — Transfer of Care (Signed)
Immediate Anesthesia Transfer of Care Note  Patient: Barbara Herring  Procedure(s) Performed: Left knee arthroscopic partial medial meniscectomy (Left: Knee)  Patient Location: PACU  Anesthesia Type: General LMA  Level of Consciousness: awake, alert  and patient cooperative  Airway and Oxygen Therapy: Patient Spontanous Breathing and Patient connected to supplemental oxygen  Post-op Assessment: Post-op Vital signs reviewed, Patient's Cardiovascular Status Stable, Respiratory Function Stable, Patent Airway and No signs of Nausea or vomiting  Post-op Vital Signs: Reviewed and stable  Complications: No notable events documented.

## 2022-04-21 NOTE — Op Note (Signed)
Operative Note    SURGERY DATE: 04/21/2022   PRE-OP DIAGNOSIS:  1. Left medial meniscus tear 2. Left knee patellofemoral degenerative changes   POST-OP DIAGNOSIS:  1. Left medial meniscus tear 2. Left knee patellofemoral degenerative changes   PROCEDURES:  1.  Left knee arthroscopy, partial medial meniscectomy 2.  Left knee arthroscopic chondroplasty of patellofemoral compartment   SURGEON: Cato Mulligan, MD   ANESTHESIA: Gen   ESTIMATED BLOOD LOSS: minimal   TOTAL IV FLUIDS: per anesthesia   INDICATION(S):  Barbara Herring is a 51 y.o. female with signs and symptoms as well as MRI finding of medial meniscus tear that began after a twisting incident while dancing.  She has failed extensive conservative management without significant improvement in her symptoms.  After discussion of risks, benefits, and alternatives to surgery, the patient elected to proceed.   OPERATIVE FINDINGS:    Examination under anesthesia: A careful examination under anesthesia was performed.  Passive range of motion was: Hyperextension: 2.  Extension: 0.  Flexion: 120.  Lachman: normal. Pivot Shift: normal.  Posterior drawer: normal.  Varus stability in full extension: normal.  Varus stability in 30 degrees of flexion: normal.  Valgus stability in full extension: normal.  Valgus stability in 30 degrees of flexion: normal.   Intra-operative findings: A thorough arthroscopic examination of the knee was performed.  The findings are: 1. Suprapatellar pouch: Normal 2. Undersurface of median ridge: Grade 1 softening and fissuring 3. Medial patellar facet: Grade 1 softening 4. Lateral patellar facet: Grade 1 softening 5. Trochlea: Central grade 4 degenerative changes measuring approximately 10 x 14 mm 6. Lateral gutter/popliteus tendon: Normal 7. Hoffa's fat pad: Inflamed 8. Medial gutter/plica: Normal 9. ACL: Normal 10. PCL: Normal 11. Medial meniscus: Complex tear with primarily radial tear pattern of  the posterior horn with horizontal component and flipped fragment posterior to the medial femoral condyle.  Radial component affecting approximately 70% of the meniscus width 12. Medial compartment cartilage: Scattered areas of grade 1 medial femoral condyle and tibial plateau 13. Lateral meniscus: Normal 14. Lateral compartment cartilage: Grade 1 softening of the tibial plateau and normal lateral femoral condyle   OPERATIVE REPORT:     I identified Barbara Herring in the pre-operative holding area. I marked the operative knee with my initials. I reviewed the risks and benefits of the proposed surgical intervention and the patient wished to proceed. The patient was transferred to the operative suite and placed in the supine position with all bony prominences padded.  Anesthesia was administered. Appropriate IV antibiotics were administered prior to incision. The extremity was then prepped and draped in standard fashion. A time out was performed confirming the correct extremity, correct patient, and correct procedure.   Arthroscopy portals were marked. Local anesthetic was injected to the planned portal sites. The anterolateral portal was established with an 11 blade.      The arthroscope was placed in the anterolateral portal and then into the suprapatellar pouch. Next, the medial portal was established under needle localization. A diagnostic knee scope was completed with the above findings. The medial meniscus tear was identified.   The MCL was pie-crusted to improve visualization of the posterior horn. The meniscal tear was debrided using an arthroscopic biter and an oscillating shaver until the meniscus had stable borders.  After debridement, there was approximately 30% of the meniscus width remaining circumferentially.  A chondroplasty of the trochlea was performed using an oscillating shaver such that there were stable cartilage edges without  any loose fragments of cartilage. Arthroscopic fluid  was removed from the joint.   The portals were closed with 3-0 Nylon suture. Sterile dressings included Xeroform, 4x4s, Sof-Rol, and Bias wrap. A Polarcare was placed.  The patient was then awakened and taken to the PACU hemodynamically stable without complication.   POSTOPERATIVE PLAN: The patient will be discharged home today once PACU criteria are met. Aspirin 325 mg daily was prescribed for 2 weeks for DVT prophylaxis.  Physical therapy will start on POD#3-7. Weight-bearing as tolerated. Follow up in 2 weeks per protocol.

## 2022-04-21 NOTE — Anesthesia Postprocedure Evaluation (Signed)
Anesthesia Post Note  Patient: Barbara Herring  Procedure(s) Performed: Left knee arthroscopic partial medial meniscectomy (Left: Knee)  Patient location during evaluation: PACU Anesthesia Type: General Level of consciousness: awake and alert Pain management: pain level controlled Vital Signs Assessment: post-procedure vital signs reviewed and stable Respiratory status: spontaneous breathing, nonlabored ventilation, respiratory function stable and patient connected to nasal cannula oxygen Cardiovascular status: blood pressure returned to baseline and stable Postop Assessment: no apparent nausea or vomiting Anesthetic complications: no   No notable events documented.   Last Vitals:  Vitals:   04/21/22 1209 04/21/22 1334  BP: 137/72   Pulse: 79   Temp: 36.7 C 36.5 C  SpO2: 98% 94%    Last Pain:  Vitals:   04/21/22 1334  TempSrc:   PainSc: 0-No pain                 Louie Boston

## 2022-04-22 ENCOUNTER — Encounter: Payer: Self-pay | Admitting: Orthopedic Surgery

## 2022-09-04 IMAGING — MG MM DIGITAL SCREENING BILAT W/ TOMO AND CAD
6 of 10 series · 6 of 30 positions shown · non-contrast
Comparison: Previous exam(s).

ACR Breast Density Category a: The breast tissue is almost entirely
fatty.

CLINICAL DATA: Screening.

EXAM:
DIGITAL SCREENING BILATERAL MAMMOGRAM WITH TOMOSYNTHESIS AND CAD
TECHNIQUE: Bilateral screening digital craniocaudal and mediolateral oblique
mammograms were obtained. Bilateral screening digital breast
tomosynthesis was performed. The images were evaluated with
computer-aided detection.

[L CV synth-2D]
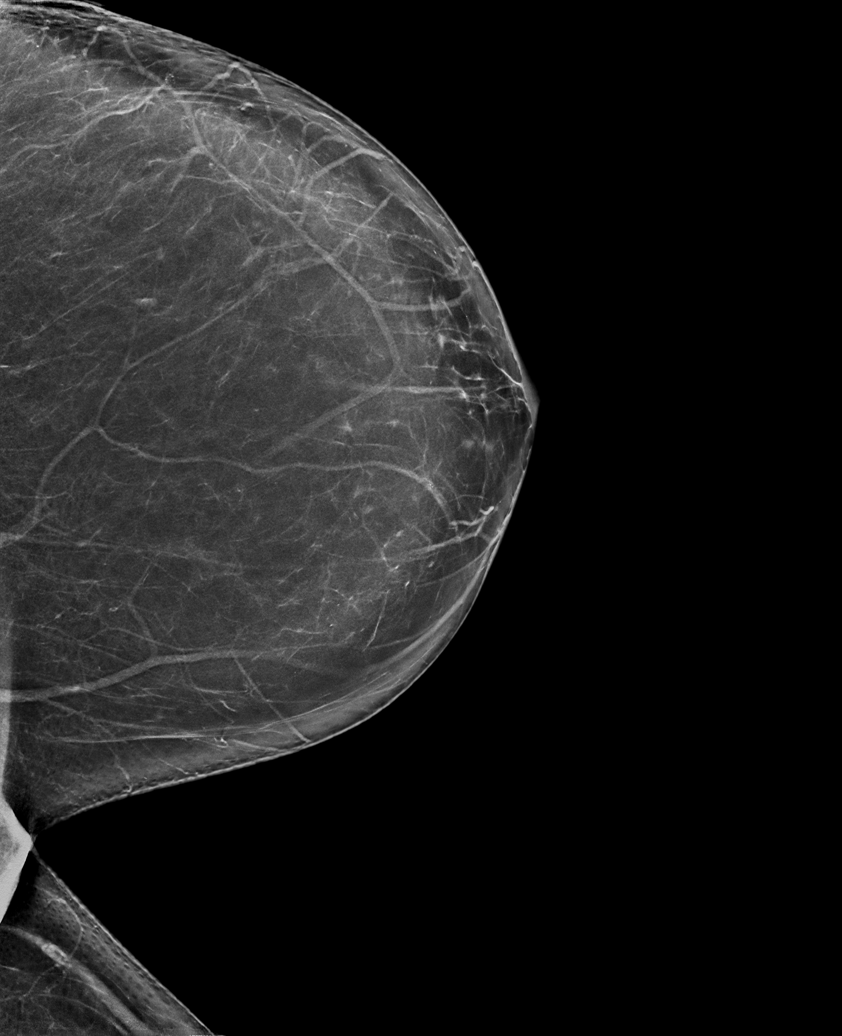

[L MLO synth-2D]
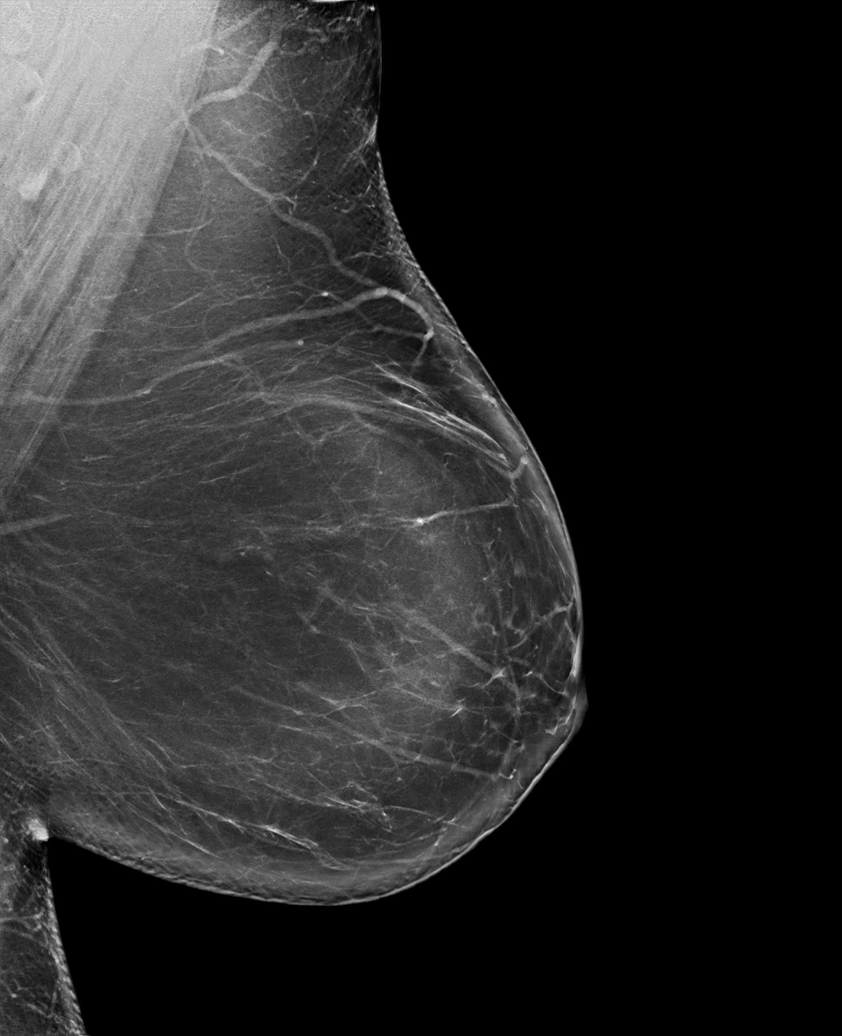

[L CC synth-2D]
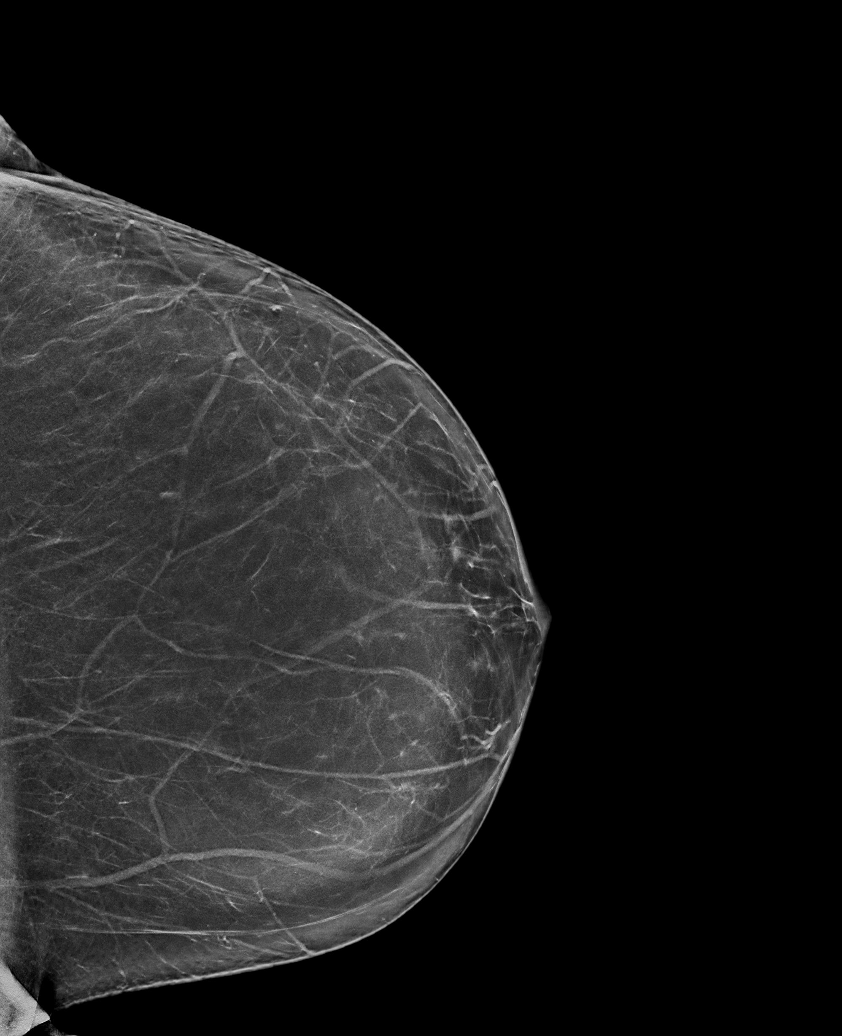

[R CC synth-2D]
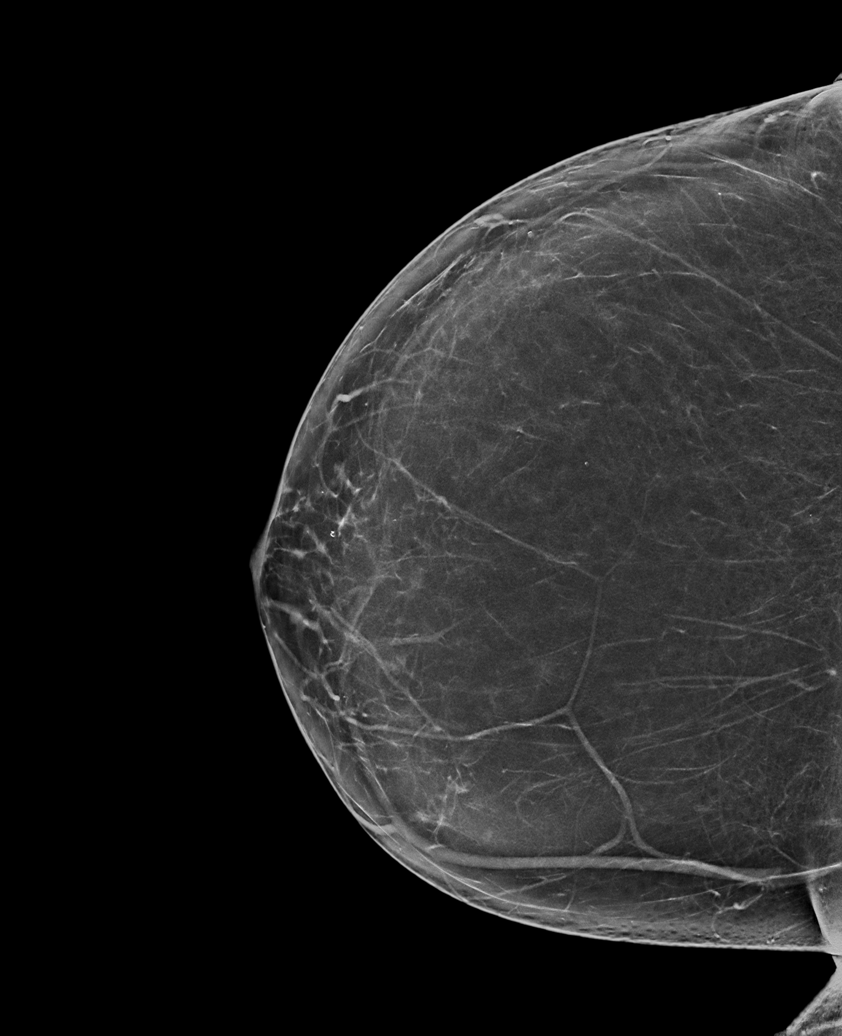

[R MLO synth-2D]
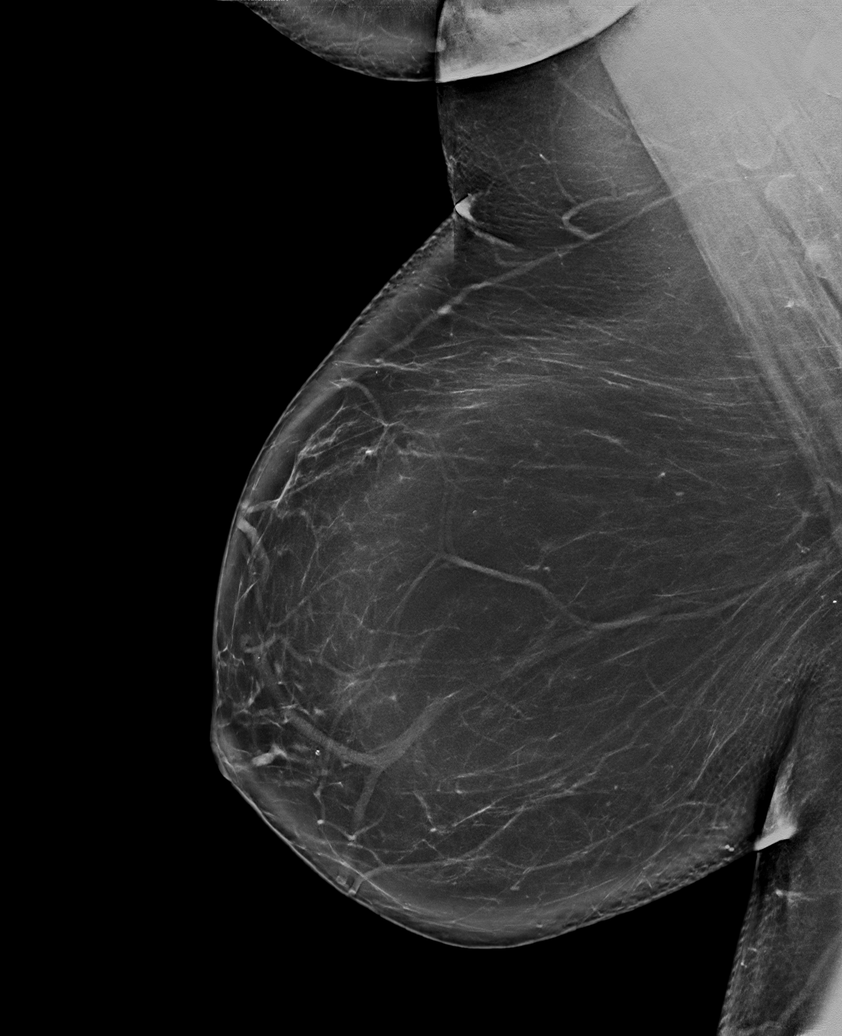

[L MLO tomo · tomo slice 53/104.0]
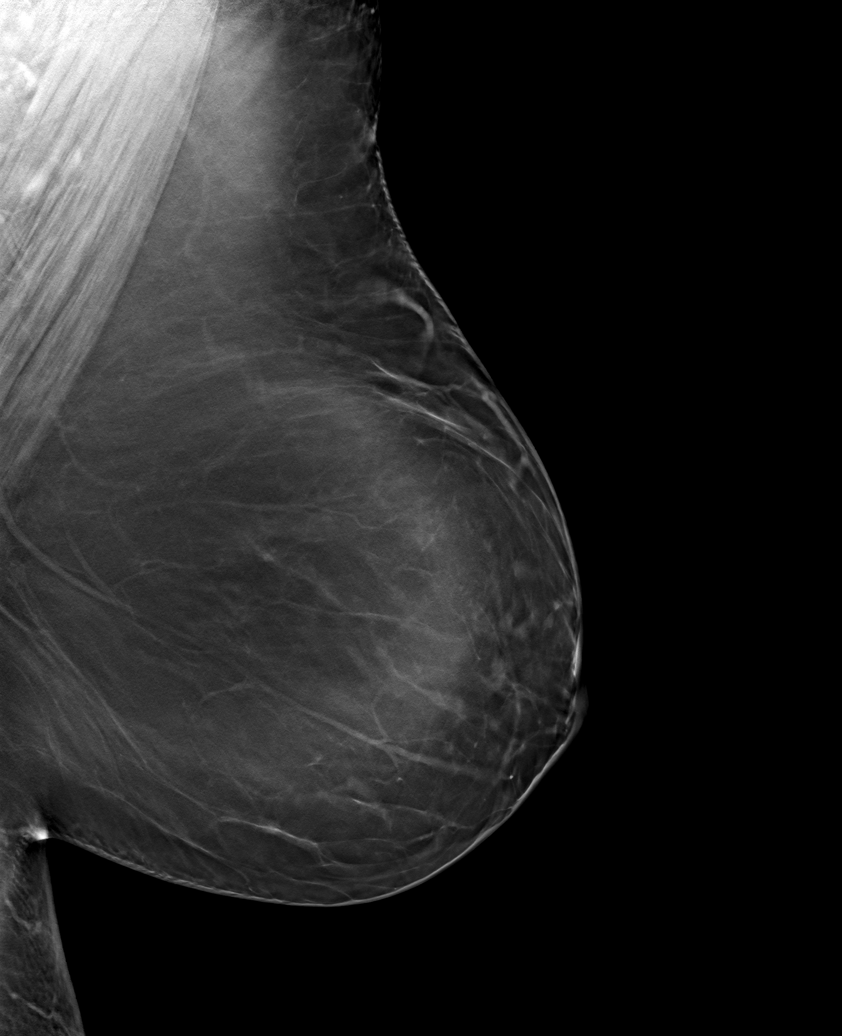

[6 of 30 positions shown; findings below may reference images not displayed]

FINDINGS: There are no findings suspicious for malignancy.
IMPRESSION: No mammographic evidence of malignancy. A result letter of this
screening mammogram will be mailed directly to the patient.

RECOMMENDATION:
Screening mammogram in one year. (Code:0E-3-N98)

BI-RADS CATEGORY  1: Negative.

## 2022-11-07 ENCOUNTER — Other Ambulatory Visit: Payer: Self-pay | Admitting: Physician Assistant

## 2022-11-07 DIAGNOSIS — Z1231 Encounter for screening mammogram for malignant neoplasm of breast: Secondary | ICD-10-CM
